# Patient Record
Sex: Male | Born: 1995 | Race: White | Hispanic: No | Marital: Single | Smoking: Never smoker
Health system: Southern US, Community
[De-identification: ages and names within clinical notes are randomized; demographics above are authoritative.]

---
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
## 2015-06-09 ENCOUNTER — Encounter: Payer: Self-pay | Admitting: Family Medicine

## 2015-06-09 ENCOUNTER — Ambulatory Visit: Payer: No Typology Code available for payment source | Admitting: Family Medicine

## 2015-06-09 ENCOUNTER — Ambulatory Visit (INDEPENDENT_AMBULATORY_CARE_PROVIDER_SITE_OTHER): Payer: No Typology Code available for payment source | Admitting: Family Medicine

## 2015-06-09 VITALS — BP 125/72 | HR 54 | Ht 75.0 in | Wt 165.0 lb

## 2015-06-09 DIAGNOSIS — S93602A Unspecified sprain of left foot, initial encounter: Secondary | ICD-10-CM | POA: Diagnosis not present

## 2015-06-09 NOTE — Patient Instructions (Signed)
You have a foot sprain. Given the concern for the lis franc joint (though there's no separation when you're standing which is a very good sign) you need to not bear weight on this foot. Crutches - call me if you want to try a knee scooter. Icing 15 minutes at a time 3-4 times a day. Ibuprofen 600mg  three times a day with food OR aleve 2 tabs twice a day with food for pain and inflammation. Elevate above your heart as needed for swelling. Follow up with me in 2 weeks for reevaluation.

## 2015-06-14 DIAGNOSIS — S93602A Unspecified sprain of left foot, initial encounter: Secondary | ICD-10-CM | POA: Insufficient documentation

## 2015-06-14 NOTE — Progress Notes (Signed)
PCP: Norman ClayLOWE,MELISSA V, MD  Subjective:   HPI: Patient is a 19 y.o. male here for left foot injury.  Patient reports on 10/18 when playing football he planted his left foot, foot rolled over and he felt a pop dorsally. Pain level 7/10. Didn't hurt initially but worsened as time went on. Developed swelling, bruising into toes. Sharp pain dorsally. Has been using crutches. Difficulty moving foot up and down. No fevers, skin changes, other complaints.  No past medical history on file.  No current outpatient prescriptions on file prior to visit.   No current facility-administered medications on file prior to visit.    No past surgical history on file.  Allergies  Allergen Reactions  . Amoxicillin     REACTION: hives  . Sulfa Antibiotics     Social History   Social History  . Marital Status: Single    Spouse Name: N/A  . Number of Children: N/A  . Years of Education: N/A   Occupational History  . Not on file.   Social History Main Topics  . Smoking status: Never Smoker   . Smokeless tobacco: Not on file  . Alcohol Use: Not on file  . Drug Use: Not on file  . Sexual Activity: Not on file   Other Topics Concern  . Not on file   Social History Narrative    Family History  Problem Relation Age of Onset  . Heart attack Paternal Grandfather 1148  . Diabetes Neg Hx   . Hypertension Neg Hx   . Sudden death Neg Hx     BP 125/72 mmHg  Pulse 54  Ht 6\' 3"  (1.905 m)  Wt 165 lb (74.844 kg)  BMI 20.62 kg/m2  Review of Systems: See HPI above.    Objective:  Physical Exam:  Gen: NAD  Left foot/ankle: No gross deformity, swelling.  Mild ecchymoses 2nd-4th digits dorsally. FROM TTP base 1st-3rd metatarsals and TMT joints. Negative ant drawer and talar tilt.   Negative syndesmotic compression. Thompsons test negative. NV intact distally.  Right foot/ankle: FROM without pain.  MSK u/s:  No evidence cortical irregularity, edema overlying cortex or  neovascularity of left 1st-3rd metatarsals.  When bearing weight there is no separation at [employer].    Assessment & Plan:  1. Left foot injury - 2/2 foot sprain.  He is tender at [employer] though no separation here on radiographs or weight bearing ultrasound.  No evidence fracture either.  Advised to use crutches with no weight bearing.  Icing, nsaids.  Elevation for swelling, bruising.  F/u in 2 weeks for reevaluation.

## 2015-06-14 NOTE — Assessment & Plan Note (Signed)
He is tender at [employer] though no separation here on radiographs or weight bearing ultrasound.  No evidence fracture either.  Advised to use crutches with no weight bearing.  Icing, nsaids.  Elevation for swelling, bruising.  F/u in 2 weeks for reevaluation.

## 2015-06-24 ENCOUNTER — Ambulatory Visit (INDEPENDENT_AMBULATORY_CARE_PROVIDER_SITE_OTHER): Payer: No Typology Code available for payment source | Admitting: Family Medicine

## 2015-06-24 ENCOUNTER — Encounter: Payer: Self-pay | Admitting: Family Medicine

## 2015-06-24 VITALS — BP 125/79 | HR 69 | Ht 74.0 in | Wt 170.0 lb

## 2015-06-24 DIAGNOSIS — S93602D Unspecified sprain of left foot, subsequent encounter: Secondary | ICD-10-CM

## 2015-06-24 NOTE — Patient Instructions (Signed)
Icing, ibuprofen only if needed now. Try arch binder for support when walking and playing sports. Home exercises 3 sets of 10 once a day with theraband - also calf raise exercises if not too painful (less than 3 on a scale of 1-10). Follow up with me in 4 weeks or as needed. I expect in 1-2 weeks you'll feel well enough to sprint, cut and if so no restrictions on sports activities.

## 2015-06-28 NOTE — Progress Notes (Signed)
PCP: Norman ClayLOWE,MELISSA V, MD  Subjective:   HPI: Patient is a 19 y.o. male here for left foot injury.  10/20: Patient reports on 10/18 when playing football he planted his left foot, foot rolled over and he felt a pop dorsally. Pain level 7/10. Didn't hurt initially but worsened as time went on. Developed swelling, bruising into toes. Sharp pain dorsally. Has been using crutches. Difficulty moving foot up and down. No fevers, skin changes, other complaints.  11/4: Patient reports he is over 80% improved. Pain level 0/10 currently. Gets some mild soreness midfoot with a lot of walking, some jogging. Used crutches for a week then weaned off these. Iced, took ibuprofen. No skin changes, fever, other complaints.  No past medical history on file.  No current outpatient prescriptions on file prior to visit.   No current facility-administered medications on file prior to visit.    No past surgical history on file.  Allergies  Allergen Reactions  . Amoxicillin     REACTION: hives  . Sulfa Antibiotics     Social History   Social History  . Marital Status: Single    Spouse Name: N/A  . Number of Children: N/A  . Years of Education: N/A   Occupational History  . Not on file.   Social History Main Topics  . Smoking status: Never Smoker   . Smokeless tobacco: Not on file  . Alcohol Use: Not on file  . Drug Use: Not on file  . Sexual Activity: Not on file   Other Topics Concern  . Not on file   Social History Narrative    Family History  Problem Relation Age of Onset  . Heart attack Paternal Grandfather 5448  . Diabetes Neg Hx   . Hypertension Neg Hx   . Sudden death Neg Hx     BP 125/79 mmHg  Pulse 69  Ht 6\' 2"  (1.88 m)  Wt 170 lb (77.111 kg)  BMI 21.82 kg/m2  Review of Systems: See HPI above.    Objective:  Physical Exam:  Gen: NAD  Left foot/ankle: No gross deformity, swelling, ecchymoses. FROM No longer with TTP base 1st-3rd metatarsals and TMT  joints. Negative ant drawer and talar tilt.   Negative syndesmotic compression. Thompsons test negative. NV intact distally.  Right foot/ankle: FROM without pain.    Assessment & Plan:  1. Left foot injury - 2/2 foot sprain.  Much improved after only 2 weeks.  Advised to start home exercises now.  Icing, nsaids only if needed.  Arch binder for support.  Advised to wait 1-2 weeks before doing sprinting, cutting sports however.  Call us if he has any problems.  F/u prn.

## 2015-06-28 NOTE — Assessment & Plan Note (Signed)
2/2 foot sprain.  Much improved after only 2 weeks.  Advised to start home exercises now.  Icing, nsaids only if needed.  Arch binder for support.  Advised to wait 1-2 weeks before doing sprinting, cutting sports however.  Call us if he has any problems.  F/u prn.
